# Patient Record
Sex: Female | Born: 1975 | Race: Black or African American | Hispanic: No | Marital: Single | Smoking: Never smoker
Health system: Southern US, Community
[De-identification: ages and names within clinical notes are randomized; demographics above are authoritative.]

## PROBLEM LIST (undated history)

## (undated) DIAGNOSIS — I10 Essential (primary) hypertension: Secondary | ICD-10-CM

## (undated) DIAGNOSIS — F419 Anxiety disorder, unspecified: Secondary | ICD-10-CM

## (undated) HISTORY — DX: Anxiety disorder, unspecified: F41.9

## (undated) HISTORY — DX: Essential (primary) hypertension: I10

---

## 2010-07-26 ENCOUNTER — Emergency Department (INDEPENDENT_AMBULATORY_CARE_PROVIDER_SITE_OTHER): Payer: Self-pay

## 2010-07-26 ENCOUNTER — Emergency Department (HOSPITAL_BASED_OUTPATIENT_CLINIC_OR_DEPARTMENT_OTHER)
Admission: EM | Admit: 2010-07-26 | Discharge: 2010-07-26 | Disposition: A | Discharge status: Home / Self Care | Payer: Self-pay | Attending: Emergency Medicine | Admitting: Emergency Medicine

## 2010-07-26 DIAGNOSIS — R079 Chest pain, unspecified: Secondary | ICD-10-CM | POA: Insufficient documentation

## 2010-07-26 DIAGNOSIS — F411 Generalized anxiety disorder: Secondary | ICD-10-CM | POA: Insufficient documentation

## 2010-07-26 DIAGNOSIS — I10 Essential (primary) hypertension: Secondary | ICD-10-CM | POA: Insufficient documentation

## 2010-07-26 DIAGNOSIS — F341 Dysthymic disorder: Secondary | ICD-10-CM | POA: Insufficient documentation

## 2010-07-26 LAB — CBC
HCT: 35.4 % — ABNORMAL LOW (ref 36.0–46.0)
Hemoglobin: 12.4 g/dL (ref 12.0–15.0)
MCV: 82.7 fL (ref 78.0–100.0)
RBC: 4.28 MIL/uL (ref 3.87–5.11)
WBC: 10.9 10*3/uL — ABNORMAL HIGH (ref 4.0–10.5)

## 2010-07-26 LAB — BASIC METABOLIC PANEL
BUN: 10 mg/dL (ref 6–23)
Chloride: 106 mEq/L (ref 96–112)
GFR calc non Af Amer: >60 mL/min (ref >60)
Glucose, Bld: 86 mg/dL (ref 70–99)
Potassium: 3.9 mEq/L (ref 3.5–5.1)
Sodium: 143 mEq/L (ref 135–145)

## 2010-07-26 LAB — POCT CARDIAC MARKERS
CKMB, poc: 1.3 ng/mL (ref 1.0–8.0)
Troponin i, poc: <0.05 ng/mL (ref 0.00–0.09)

## 2011-07-13 ENCOUNTER — Ambulatory Visit: Payer: PRIVATE HEALTH INSURANCE

## 2012-06-18 IMAGING — CR DG CHEST 2V
2 series · 2 of 2 positions shown · non-contrast
Comparison: None

CLINICAL DATA: Chest pain.

CHEST - 2 VIEW

[w chest pa *]
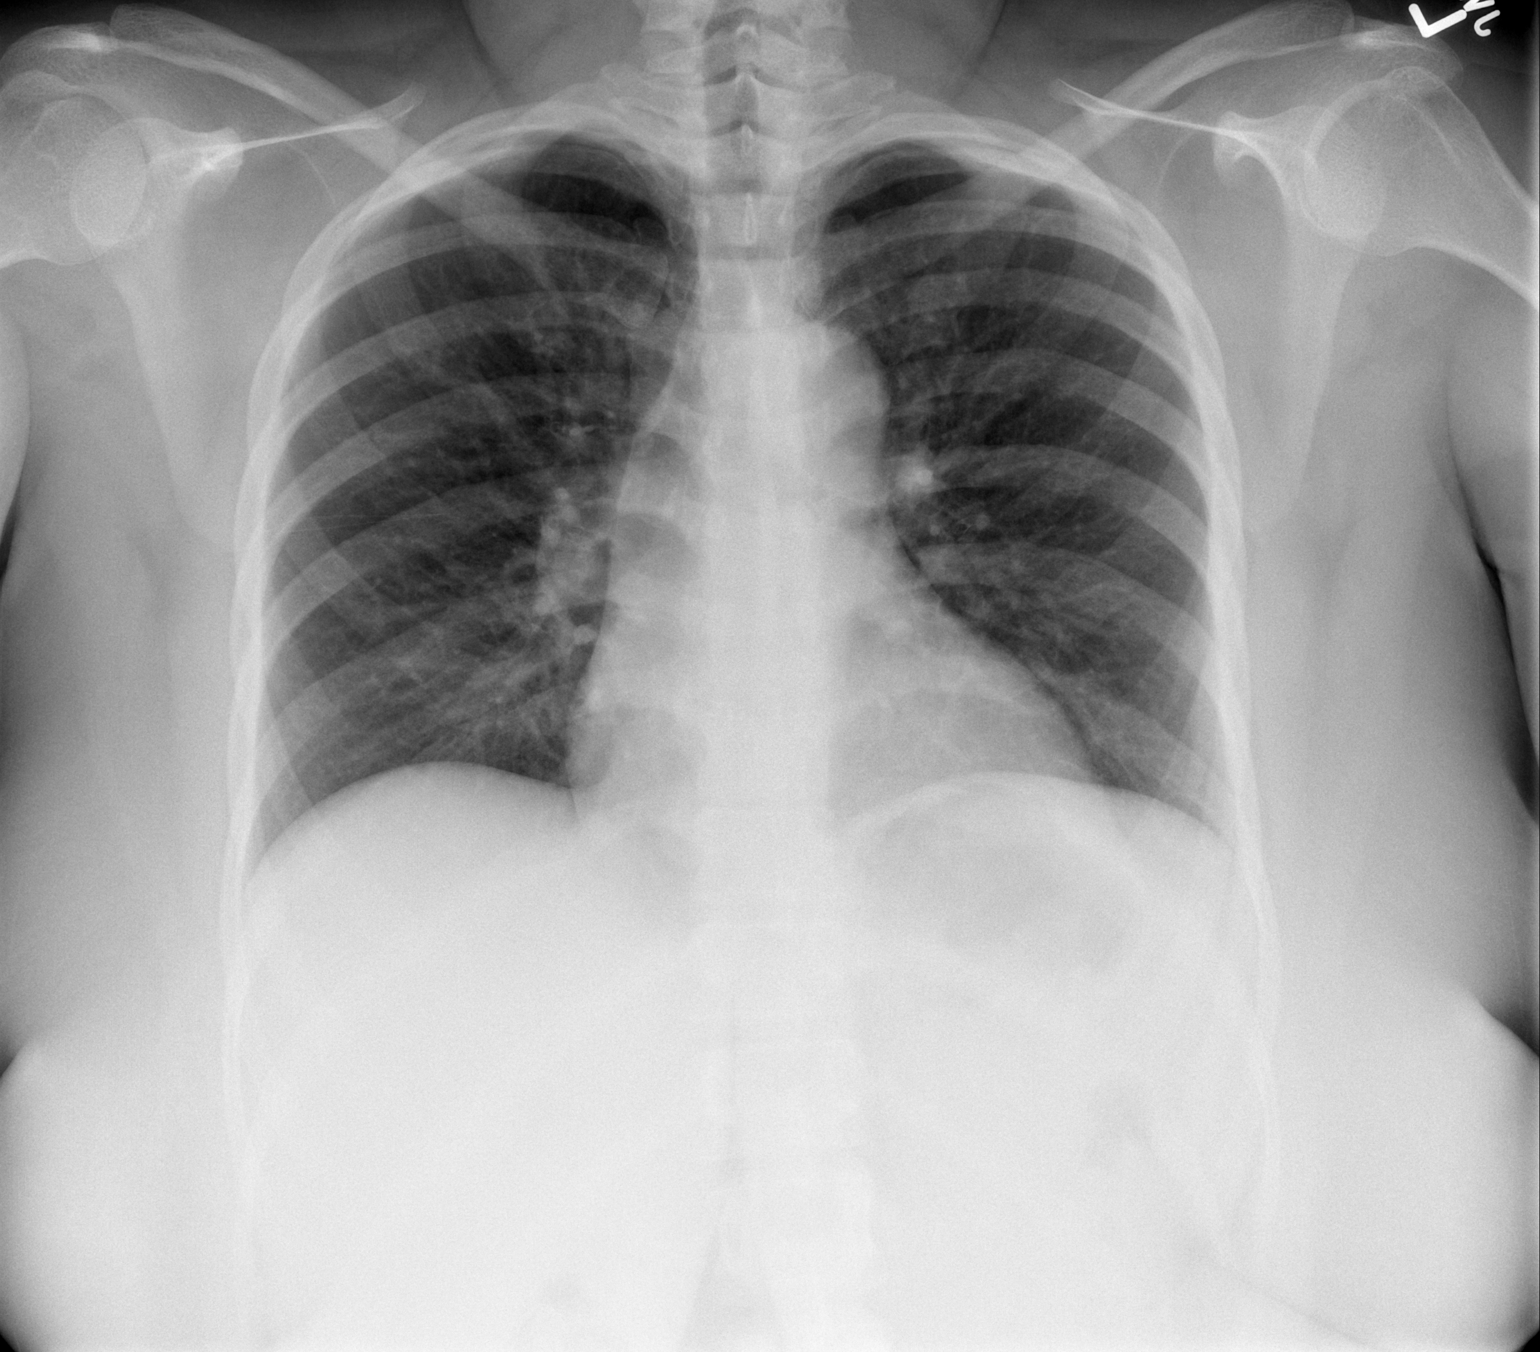

[w chest lat]
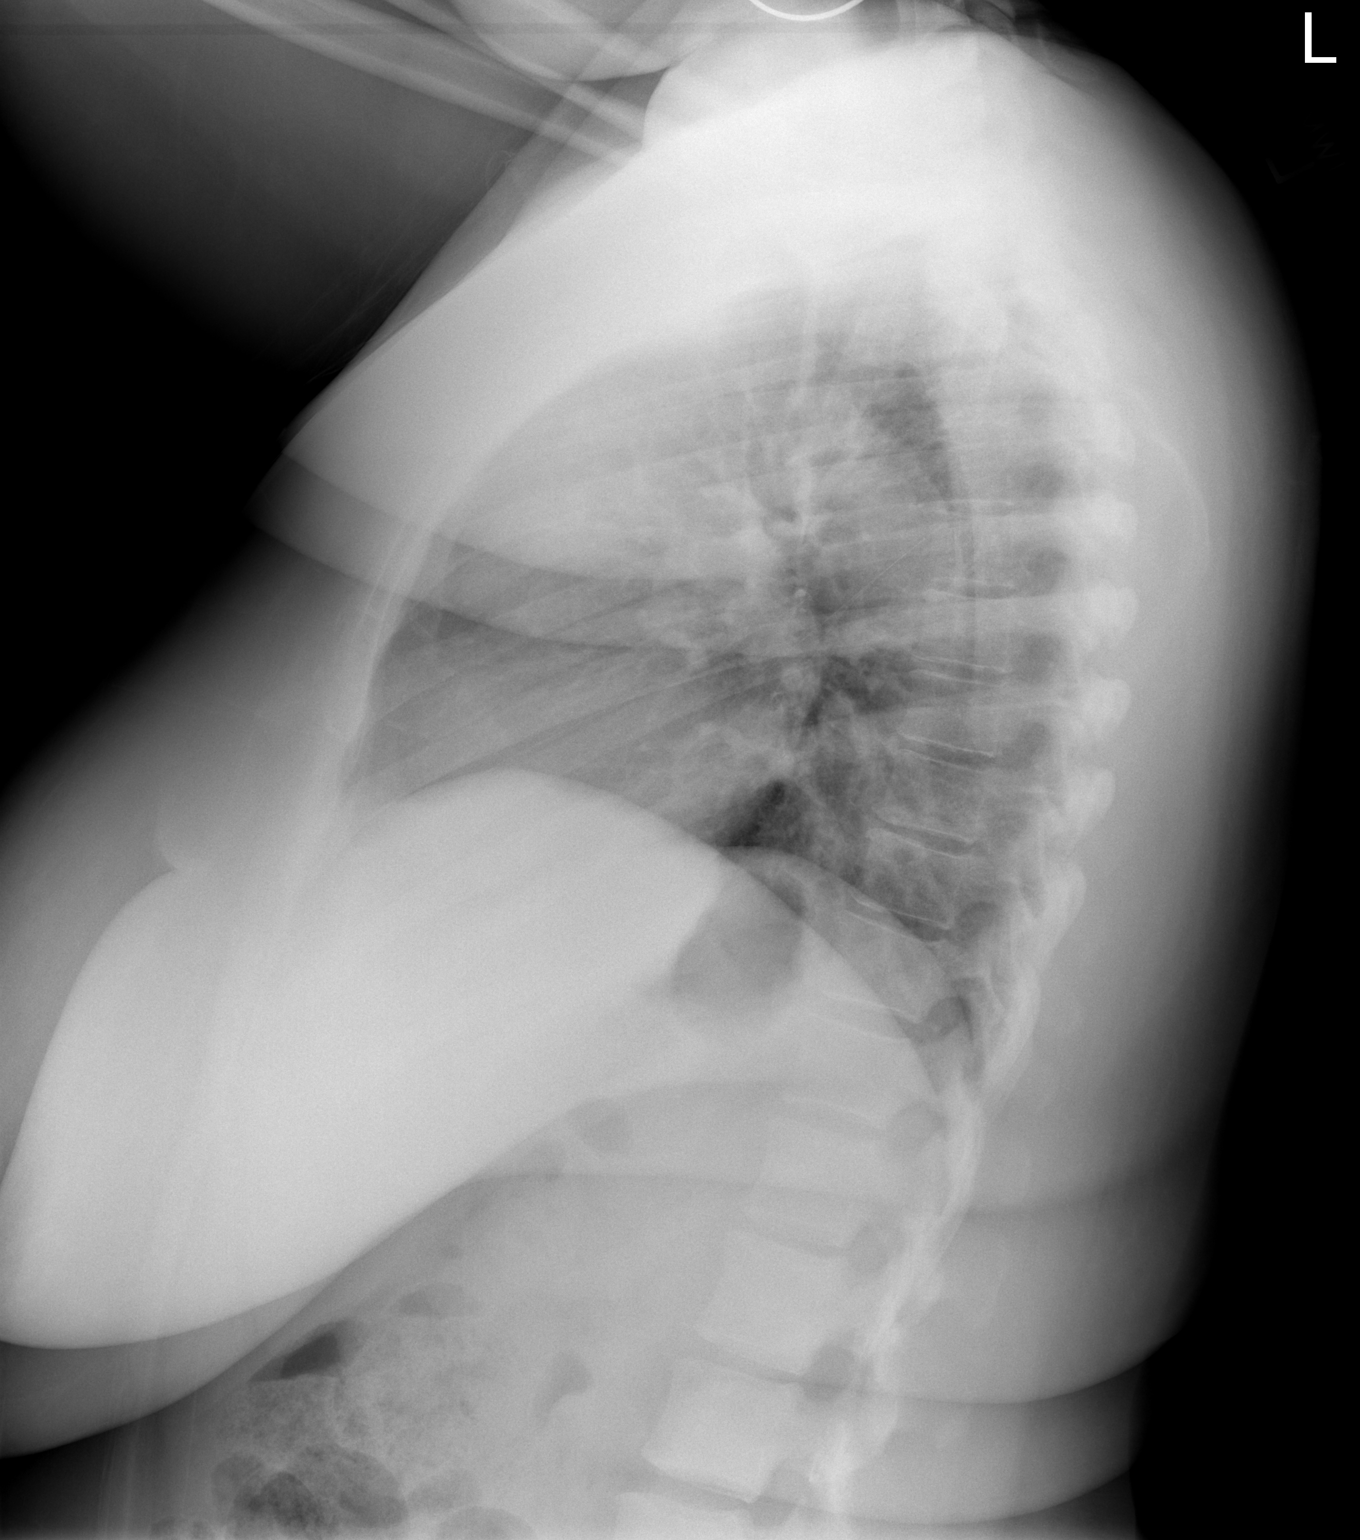

[2 of 2 positions shown; findings below may reference images not displayed]

FINDINGS: The heart size and mediastinal contours are within normal
limits.  Both lungs are clear.  The visualized skeletal structures
are unremarkable.
IMPRESSION: No active disease.

## 2012-10-13 ENCOUNTER — Ambulatory Visit (INDEPENDENT_AMBULATORY_CARE_PROVIDER_SITE_OTHER): Payer: BC Managed Care – PPO | Admitting: Family Medicine

## 2012-10-13 VITALS — BP 146/90 | HR 87 | Temp 98.8°F | Resp 18 | Ht 62.5 in | Wt 229.7 lb

## 2012-10-13 DIAGNOSIS — I10 Essential (primary) hypertension: Secondary | ICD-10-CM

## 2012-10-13 DIAGNOSIS — F329 Major depressive disorder, single episode, unspecified: Secondary | ICD-10-CM | POA: Insufficient documentation

## 2012-10-13 DIAGNOSIS — E669 Obesity, unspecified: Secondary | ICD-10-CM | POA: Insufficient documentation

## 2012-10-13 DIAGNOSIS — F410 Panic disorder [episodic paroxysmal anxiety] without agoraphobia: Secondary | ICD-10-CM

## 2012-10-13 LAB — COMPREHENSIVE METABOLIC PANEL
ALT: 23 U/L (ref 0–35)
AST: 11 U/L (ref 0–37)
CO2: 26 mEq/L (ref 19–32)
Calcium: 9.4 mg/dL (ref 8.4–10.5)
Chloride: 105 mEq/L (ref 96–112)
Creat: 0.54 mg/dL (ref 0.50–1.10)
Sodium: 141 mEq/L (ref 135–145)
Total Bilirubin: 0.5 mg/dL (ref 0.3–1.2)
Total Protein: 7.3 g/dL (ref 6.0–8.3)

## 2012-10-13 MED ORDER — CITALOPRAM HYDROBROMIDE 20 MG PO TABS: 20.0000 mg | ORAL_TABLET | Freq: Every day | ORAL | Status: AC

## 2012-10-13 MED ORDER — LISINOPRIL 10 MG PO TABS: 10.0000 mg | ORAL_TABLET | Freq: Every day | ORAL | Status: AC

## 2012-10-13 MED ORDER — ALPRAZOLAM 0.25 MG PO TABS: 0.2500 mg | ORAL_TABLET | Freq: Two times a day (BID) | ORAL | Status: DC | PRN

## 2012-10-13 NOTE — Progress Notes (Signed)
[facility] [address], [place] [address] [phone]  Date:  10/13/2012   Name:  Beverly Velasquez   DOB:  [birth year]   MRN:  [id number]  PCP:  No PCP Per Patient    Chief Complaint: Hypertension   History of Present Illness:  Beverly Velasquez is a 37 y.o. very pleasant female patient who presents with the following:  She is here today due to being out of her HTN medication for a couple of months- she takes lisinopril 10mg  a day and has been on this for about one year.   She has generally done well with lisinopril and does not note any SE.     She also notes that she sometimes gets anxious.  She feels that she has panic attacks.  She just recently started having these again, and she has had them for the past 2 years or so and has been evaluated for an associated episode of CP at the Ed (2012).   She used to take xanax for her anxiety- she last used this about 2 years ago, per her doctor at [facility].  She was given the xanax when she followed up after her ED visit.  She is fairly stressed right now- her grandmother died and she is getting divorced. She does feel that she is depressed having a hard time sleeping, less motivated than usual, appetite ok, no SI or HI.  She thinks she has taken celexa in the past with success.    LMP 10/05/12  There are no active problems to display for this patient.   Past Medical History  Diagnosis Date  . Hypertension   . Anxiety     History reviewed. No pertinent past surgical history.  History  Substance Use Topics  . Smoking status: Never Smoker   . Smokeless tobacco: Not on file  . Alcohol Use: Yes    Family History  Problem Relation Age of Onset  . Hypertension Mother   . Hypertension Father   . Diabetes Father     No Known Allergies  Medication list has been reviewed and updated.  No current outpatient prescriptions on file prior to visit.   No current facility-administered medications on file  prior to visit.    Review of Systems:  As per HPI- otherwise negative. Denies any SI or HI  Physical Examination: Filed Vitals:   10/13/12 1621  BP: 146/90  Pulse: 87  Temp: 98.8 F (37.1 C)  Resp: 18   Filed Vitals:   10/13/12 1621  Height: 5' 2.5" (1.588 m)  Weight: 229 lb 11.2 oz (104.191 kg)   Body mass index is 41.32 kg/(m^2). Ideal Body Weight: Weight in (lb) to have BMI = 25: 138.6  GEN: WDWN, NAD, Non-toxic, A & O x 3, obese HEENT: Atraumatic, Normocephalic. Neck supple. No masses, No LAD. Ears and Nose: No external deformity. CV: RRR, No M/G/R. No JVD. No thrill. No extra heart sounds. PULM: CTA B, no wheezes, crackles, rhonchi. No retractions. No resp. distress. No accessory muscle use. ABD: S, NT, ND, +BS. No rebound. No HSM. EXTR: No c/c/e NEURO Normal gait.  PSYCH: Normally interactive. Conversant. Not depressed or anxious appearing.  Calm demeanor.    Assessment and Plan: Essential hypertension, benign - Plan: lisinopril (PRINIVIL,ZESTRIL) 10 MG tablet, Comprehensive metabolic panel  Depression - Plan: citalopram (CELEXA) 20 MG tablet  Panic attacks - Plan: ALPRAZolam (XANAX) 0.25 MG tablet  Obesity, unspecified  HTN- restart medication,  check CMP.  Recheck in a few weeks, and follow- up pending labs. Warned pt to avoid pregnancy while on lisinopril.  She was not aware of this, but states she has no plans to conceive anytime soon  Panic attacks and depression; start celexa, and gave a low dose of xanax to use prn.  Asked her to contact me with an update in a couple of weeks   Signed Abbe Amsterdam, MD

## 2012-10-13 NOTE — Patient Instructions (Addendum)
Start back on the lisinopril for your blood pressure- let me know if this is not helpful.   Start the celexa at 20 mg a day- in about 2 weeks please contact me with an update Use the xanax sparingly as needed for panic.

## 2012-10-14 ENCOUNTER — Encounter: Payer: Self-pay | Admitting: Family Medicine

## 2012-11-02 ENCOUNTER — Telehealth: Payer: Self-pay

## 2012-11-02 NOTE — Telephone Encounter (Signed)
Did not call her, but Dr Patsy Lager may have.

## 2012-11-02 NOTE — Telephone Encounter (Signed)
Pt states that someone called her on Monday and she is not sure who called I didn't see any documentation about a phone call so she asked if I could put in a message to see why someone could have called her Call back number is [phone]

## 2012-11-03 NOTE — Telephone Encounter (Signed)
I don't think that I did- is there anything that she needs? JC

## 2012-11-07 NOTE — Telephone Encounter (Signed)
Called her back and LMOM- I do not think that I called her, but please let me know if there is something that she needs.  Also, please come and see me in the next few weeks to recheck her BP

## 2013-04-02 ENCOUNTER — Ambulatory Visit: Payer: BC Managed Care – PPO | Admitting: Family Medicine

## 2013-06-01 ENCOUNTER — Other Ambulatory Visit: Payer: Self-pay | Admitting: Family Medicine

## 2013-06-01 DIAGNOSIS — F411 Generalized anxiety disorder: Secondary | ICD-10-CM

## 2013-06-04 NOTE — Telephone Encounter (Signed)
Pt is overdue for f/up. I have pended x 1 w/note to RTC for more.

## 2013-10-20 ENCOUNTER — Encounter (HOSPITAL_COMMUNITY): Payer: Self-pay | Admitting: Emergency Medicine

## 2013-10-20 ENCOUNTER — Emergency Department (HOSPITAL_COMMUNITY)
Admission: EM | Admit: 2013-10-20 | Discharge: 2013-10-20 | Disposition: A | Discharge status: Home / Self Care | Payer: BC Managed Care – PPO | Source: Home / Self Care | Attending: Family Medicine | Admitting: Family Medicine

## 2013-10-20 DIAGNOSIS — K047 Periapical abscess without sinus: Secondary | ICD-10-CM

## 2013-10-20 MED ORDER — CLINDAMYCIN HCL 300 MG PO CAPS: 300.0000 mg | ORAL_CAPSULE | Freq: Three times a day (TID) | ORAL | Status: AC

## 2013-10-20 NOTE — ED Provider Notes (Signed)
CSN: [id number]     Arrival date & time 10/20/13  1818 History   First MD Initiated Contact with Patient 10/20/13 1826     No chief complaint on file.  (Consider location/radiation/quality/duration/timing/severity/associated sxs/prior Treatment) Patient is a 38 y.o. female presenting with tooth pain.  Dental Pain Location:  Upper Upper teeth location:  15/LU 2nd molar Quality:  Dull and throbbing Severity:  Moderate Onset quality:  Gradual Duration:  5 days Progression:  Worsening Chronicity:  New Context: dental caries and poor dentition   Associated symptoms: no facial swelling and no fever     Past Medical History  Diagnosis Date  . Hypertension   . Anxiety    No past surgical history on file. Family History  Problem Relation Age of Onset  . Hypertension Mother   . Hypertension Father   . Diabetes Father    History  Substance Use Topics  . Smoking status: Never Smoker   . Smokeless tobacco: Not on file  . Alcohol Use: Yes   OB History   Grav Para Term Preterm Abortions TAB SAB Ect Mult Living                 Review of Systems  Constitutional: Negative for fever.  HENT: Positive for dental problem. Negative for facial swelling.     Allergies  Review of patient's allergies indicates no known allergies.  Home Medications   Prior to Admission medications   Medication Sig Start Date End Date Taking? Authorizing Provider  ALPRAZolam (XANAX) 0.25 MG tablet Take 1 tablet (0.25 mg total) by mouth 2 (two) times daily as needed. PATIENT NEEDS OFFICE VISIT FOR ADDITIONAL REFILLS    Gwenlyn FoundJessica C Copland, MD  citalopram (CELEXA) 20 MG tablet Take 1 tablet (20 mg total) by mouth daily. 10/13/12   Gwenlyn FoundJessica C Copland, MD  clindamycin (CLEOCIN) 300 MG capsule Take 1 capsule (300 mg total) by mouth 3 (three) times daily. 10/20/13   Linna HoffJames D Kindl, MD  lisinopril (PRINIVIL,ZESTRIL) 10 MG tablet Take 1 tablet (10 mg total) by mouth daily. 10/13/12   Gwenlyn FoundJessica C Copland, MD   BP  175/118  Pulse 96  Temp(Src) 98.5 F (36.9 C) (Oral)  SpO2 100% Physical Exam  Nursing note and vitals reviewed. Constitutional: She is oriented to person, place, and time. She appears well-developed and well-nourished. No distress.  HENT:  Mouth/Throat: Uvula is midline, oropharynx is clear and moist and mucous membranes are normal. Abnormal dentition. Dental abscesses and dental caries present.    Lymphadenopathy:    She has no cervical adenopathy.  Neurological: She is alert and oriented to person, place, and time.  Skin: Skin is warm and dry.    ED Course  Procedures (including critical care time) Labs Review Labs Reviewed - No data to display  Imaging Review No results found.   MDM   1. Dental abscess        Linna HoffJames D Kindl, MD 10/20/13 [phone]

## 2013-10-20 NOTE — ED Notes (Signed)
Pt         Has     Toothache      l  Side      Face  Of    Face  History  Of  Dental  Caries  In  Past

## 2016-10-04 ENCOUNTER — Encounter: Payer: Self-pay | Admitting: Physician Assistant

## 2016-10-04 ENCOUNTER — Ambulatory Visit (INDEPENDENT_AMBULATORY_CARE_PROVIDER_SITE_OTHER): Payer: BLUE CROSS/BLUE SHIELD | Admitting: Physician Assistant

## 2016-10-04 ENCOUNTER — Ambulatory Visit (INDEPENDENT_AMBULATORY_CARE_PROVIDER_SITE_OTHER): Payer: BLUE CROSS/BLUE SHIELD

## 2016-10-04 VITALS — BP 140/94 | HR 98 | Temp 98.0°F | Resp 18 | Ht 62.36 in | Wt 220.4 lb

## 2016-10-04 DIAGNOSIS — R1030 Lower abdominal pain, unspecified: Secondary | ICD-10-CM | POA: Diagnosis not present

## 2016-10-04 DIAGNOSIS — D72829 Elevated white blood cell count, unspecified: Secondary | ICD-10-CM

## 2016-10-04 LAB — POCT URINALYSIS DIP (MANUAL ENTRY)
BILIRUBIN UA: NEGATIVE mg/dL
Bilirubin, UA: NEGATIVE
GLUCOSE UA: NEGATIVE mg/dL
Leukocytes, UA: NEGATIVE
Nitrite, UA: NEGATIVE
PROTEIN UA: NEGATIVE mg/dL
SPEC GRAV UA: 1.01 (ref 1.010–1.025)
Urobilinogen, UA: 0.2 E.U./dL
pH, UA: 6 (ref 5.0–8.0)

## 2016-10-04 LAB — CMP14+EGFR
A/G RATIO: 1.2 (ref 1.2–2.2)
ALBUMIN: 4.3 g/dL (ref 3.5–5.5)
ALT: 35 IU/L — ABNORMAL HIGH (ref 0–32)
AST: 16 IU/L (ref 0–40)
Alkaline Phosphatase: 103 IU/L (ref 39–117)
BILIRUBIN TOTAL: 0.4 mg/dL (ref 0.0–1.2)
BUN / CREAT RATIO: 15 (ref 9–23)
BUN: 12 mg/dL (ref 6–24)
CHLORIDE: 99 mmol/L (ref 96–106)
CO2: 27 mmol/L (ref 20–29)
Calcium: 9.7 mg/dL (ref 8.7–10.2)
Creatinine, Ser: 0.81 mg/dL (ref 0.57–1.00)
GFR calc non Af Amer: 91 mL/min/{1.73_m2} (ref >59)
GFR, EST AFRICAN AMERICAN: 105 mL/min/{1.73_m2} (ref >59)
Globulin, Total: 3.5 g/dL (ref 1.5–4.5)
Glucose: 92 mg/dL (ref 65–99)
POTASSIUM: 4 mmol/L (ref 3.5–5.2)
Sodium: 139 mmol/L (ref 134–144)
TOTAL PROTEIN: 7.8 g/dL (ref 6.0–8.5)

## 2016-10-04 LAB — POCT CBC
Granulocyte percent: 74.9 %G (ref 37–80)
HEMATOCRIT: 30.7 % — AB (ref 37.7–47.9)
Hemoglobin: 10.5 g/dL — AB (ref 12.2–16.2)
LYMPH, POC: 2.4 (ref 0.6–3.4)
MCH, POC: 28.3 pg (ref 27–31.2)
MCHC: 34.4 g/dL (ref 31.8–35.4)
MCV: 82.4 fL (ref 80–97)
MID (cbc): 0.4 (ref 0–0.9)
MPV: 7.4 fL (ref 0–99.8)
POC GRANULOCYTE: 8.2 — AB (ref 2–6.9)
POC LYMPH PERCENT: 21.8 %L (ref 10–50)
POC MID %: 3.3 % (ref 0–12)
Platelet Count, POC: 393 10*3/uL (ref 142–424)
RBC: 3.72 M/uL — AB (ref 4.04–5.48)
RDW, POC: 14.8 %
WBC: 11 10*3/uL — AB (ref 4.6–10.2)

## 2016-10-04 LAB — POC MICROSCOPIC URINALYSIS (UMFC): MUCUS RE: ABSENT

## 2016-10-04 LAB — POCT URINE PREGNANCY: Preg Test, Ur: NEGATIVE

## 2016-10-04 LAB — POCT WET + KOH PREP
Trich by wet prep: ABSENT
Yeast by KOH: ABSENT
Yeast by wet prep: ABSENT

## 2016-10-04 MED ORDER — KETOROLAC TROMETHAMINE 60 MG/2ML IM SOLN
60.0000 mg | Freq: Once | INTRAMUSCULAR | Status: AC
  Administered 2016-10-04: 60 mg via INTRAMUSCULAR

## 2016-10-04 MED ORDER — KETOROLAC TROMETHAMINE 10 MG PO TABS: 10.0000 mg | ORAL_TABLET | Freq: Four times a day (QID) | ORAL | 0 refills | Status: AC | PRN

## 2016-10-04 NOTE — Patient Instructions (Addendum)
Your plain films did not show any kidney stone. The medication you were given a shot of today should help if this pain is related to your menstrual cycle or an underlying stone that was not present on the xray. I have sent your urine off for a culture and should have those results in 48 hours. As mentioned in office, we need you to be off of your cycle to further evaluate this pain as it may just be related to your cycle. Therefore, I have given you some oral medication to use for the next couple days. If your pain persists after your cycle ends, please return for further evaluation. If anything worsens in the meantime, seek care immediately at the ED. If your pain persists, we may need to do further imaging. Thank you for letting me participate in your health and well being.   IF you received an x-ray today, you will receive an invoice from [facility]. Please contact [facility] at [phone] with questions or concerns regarding your invoice.   IF you received labwork today, you will receive an invoice from [facility]. Please contact [facility] at [phone] with questions or concerns regarding your invoice.   Our billing staff will not be able to assist you with questions regarding bills from these companies.  You will be contacted with the lab results as soon as they are available. The fastest way to get your results is to activate your My Chart account. Instructions are located on the last page of this paperwork. If you have not heard from us regarding the results in 2 weeks, please contact this office.

## 2016-10-04 NOTE — Progress Notes (Signed)
Beverly Velasquez  MRN: [id number] DOB: [birth year]  Subjective:  Beverly Velasquez is a 41 y.o. female who presents for evaluation of abdominal pain. Onset was 2 weeks ago. Symptoms have been gradually improving. The pain is described as aching, and is 8/10 in intensity. Pain is located in the suprapubic region with radiation to right back.  Aggravating factors: none.  Alleviating factors:none. Tried aleve and it did not work. Associated symptoms: urinary frequency and nausea. Does note she has also felt a little constipated. Last bowel movement was today, had to strain a little. The patient denies anorexia, chills, diarrhea, dysuria, fever, hematochezia, hematuria, melena, myalgias, sweats and vomiting. She is currently on her menstrual cycle, started about a week ago. Notes she always has cramps during her cycles but they usually resolve with aleve. Denies sexual activity. Drinks a glass of wine twice a week. Denies smoking. No past surgical history.   She saw her PCP, Dr. Ronnald Ramp, for this two weeks ago and he treated her with bactrim for a bladder infection. She completed antibiotics and notes it got a little better but not fully.   Review of Systems  HENT: Negative for congestion.   Respiratory: Negative for cough and shortness of breath.   Cardiovascular: Negative for chest pain and palpitations.  Genitourinary: Negative for vaginal discharge and vaginal pain.  Neurological: Negative for weakness and numbness.      Patient Active Problem List   Diagnosis Date Noted  . Essential hypertension, benign 10/13/2012  . Obesity, unspecified 10/13/2012  . Depression 10/13/2012    Current Outpatient Prescriptions on File Prior to Visit  Medication Sig Dispense Refill  . ALPRAZolam (XANAX) 0.25 MG tablet Take 1 tablet (0.25 mg total) by mouth 2 (two) times daily as needed. PATIENT NEEDS OFFICE VISIT FOR ADDITIONAL REFILLS (Patient not taking: Reported on 10/04/2016) 20 tablet 0  . citalopram  (CELEXA) 20 MG tablet Take 1 tablet (20 mg total) by mouth daily. (Patient not taking: Reported on 10/04/2016) 30 tablet 3  . clindamycin (CLEOCIN) 300 MG capsule Take 1 capsule (300 mg total) by mouth 3 (three) times daily. (Patient not taking: Reported on 10/04/2016) 21 capsule 0  . lisinopril (PRINIVIL,ZESTRIL) 10 MG tablet Take 1 tablet (10 mg total) by mouth daily. (Patient not taking: Reported on 10/04/2016) 90 tablet 3   No current facility-administered medications on file prior to visit.     No Known Allergies     Social History   Social History  . Marital status: Married    Spouse name: N/A  . Number of children: N/A  . Years of education: N/A   Occupational History  . Not on file.   Social History Main Topics  . Smoking status: Never Smoker  . Smokeless tobacco: Never Used  . Alcohol use 1.8 oz/week    2 Glasses of wine, 1 Shots of liquor per week  . Drug use: No  . Sexual activity: Not Currently   Other Topics Concern  . Not on file   Social History Narrative  . No narrative on file    Objective:  BP (!) 140/94 (BP Location: Right Arm, Patient Position: Sitting, Cuff Size: Large)   Pulse 98   Temp 98 F (36.7 C) (Oral)   Resp 18   Ht 5' 2.36" (1.584 m)   Wt 220 lb 6.4 oz (100 kg)   LMP 09/24/2016 (Exact Date)   SpO2 99%   BMI 39.85 kg/m   Physical Exam  Constitutional: She  is oriented to person, place, and time and well-developed, well-nourished, and in no distress.  HENT:  Head: Normocephalic and atraumatic.  Eyes: Conjunctivae are normal.  Neck: Normal range of motion.  Pulmonary/Chest: Effort normal.  Abdominal: Soft. Bowel sounds are normal. There is generalized tenderness (mild, most notable in suprapubic region). There is no rigidity, no guarding, no CVA tenderness, no tenderness at McBurney's point and negative Murphy's sign.  Genitourinary: Uterus normal, cervix normal, right adnexa normal, left adnexa normal and vulva normal. Thin  bloody  brown   red and vaginal discharge found.  Neurological: She is alert and oriented to person, place, and time. Gait normal.  Skin: Skin is warm and dry.  Psychiatric: Affect normal.  Vitals reviewed.    Results for orders placed or performed in visit on 10/04/16 (from the past 24 hour(s))  POCT urinalysis dipstick     Status: Abnormal   Collection Time: 10/04/16 11:01 AM  Result Value Ref Range   Color, UA yellow yellow   Clarity, UA clear clear   Glucose, UA negative negative mg/dL   Bilirubin, UA negative negative   Ketones, POC UA negative negative mg/dL   Spec Grav, UA 9.286 2.870 - 1.025   Blood, UA trace-lysed (A) negative   pH, UA 6.0 5.0 - 8.0   Protein Ur, POC negative negative mg/dL   Urobilinogen, UA 0.2 0.2 or 1.0 E.U./dL   Nitrite, UA Negative Negative   Leukocytes, UA Negative Negative  POCT urine pregnancy     Status: None   Collection Time: 10/04/16 12:12 PM  Result Value Ref Range   Preg Test, Ur Negative Negative  POCT CBC     Status: Abnormal   Collection Time: 10/04/16 12:17 PM  Result Value Ref Range   WBC 11.0 (A) 4.6 - 10.2 K/uL   Lymph, poc 2.4 0.6 - 3.4   POC LYMPH PERCENT 21.8 10 - 50 %L   MID (cbc) 0.4 0 - 0.9   POC MID % 3.3 0 - 12 %M   POC Granulocyte 8.2 (A) 2 - 6.9   Granulocyte percent 74.9 37 - 80 %G   RBC 3.72 (A) 4.04 - 5.48 M/uL   Hemoglobin 10.5 (A) 12.2 - 16.2 g/dL   HCT, POC 48.2 (A) 47.8 - 47.9 %   MCV 82.4 80 - 97 fL   MCH, POC 28.3 27 - 31.2 pg   MCHC 34.4 31.8 - 35.4 g/dL   RDW, POC 14.5 %   Platelet Count, POC 393 142 - 424 K/uL   MPV 7.4 0 - 99.8 fL  POCT Microscopic Urinalysis ([facility])     Status: Abnormal   Collection Time: 10/04/16 12:19 PM  Result Value Ref Range   WBC,UR,HPF,POC None None WBC/hpf   RBC,UR,HPF,POC None None RBC/hpf   Bacteria Many (A) None, Too numerous to count   Mucus Absent Absent   Epithelial Cells, UR Per Microscopy Few (A) None, Too numerous to count cells/hpf  POCT Wet + KOH Prep     Status: Abnormal     Collection Time: 10/04/16 12:19 PM  Result Value Ref Range   Yeast by KOH Absent Absent   Yeast by wet prep Absent Absent   WBC by wet prep None (A) Few   Clue Cells Wet Prep HPF POC Few (A) None   Trich by wet prep Absent Absent   Bacteria Wet Prep HPF POC Many (A) Few   Epithelial Cells By [employer] Pref ([facility]) Few None, Few, Too numerous to  count   RBC,UR,HPF,POC None None RBC/hpf   Dg Abd 1 View  Result Date: 10/04/2016 CLINICAL DATA:  41 year old female with intermittent suprapubic pain extending to the right back. Initial encounter. EXAM: ABDOMEN - 1 VIEW COMPARISON:  None. FINDINGS: The bowel gas pattern is normal. No radio-opaque calculi or other significant radiographic abnormality are seen. The possibility of free intraperitoneal air cannot be assessed on a supine view. IMPRESSION: Negative. Electronically Signed   By: Genia Del M.D.   On: 10/04/2016 13:00    Assessment and Plan :   1. Lower abdominal pain Labs pending. POCT testing, plain films, and PE findings are reassuring. She appears stable. This could likely be due to menstrual cycle cramps. DDx includes nephrolithiasis and ovarian cyst. While urine culture is pending, will treat with NSAIDs. Pt informed to return if symptoms persist once cycle has resolved, or sooner if symptoms worsen. Consider CT renal study or pelvic US if symptoms persist. Pt given strict ED precautions .- POCT urinalysis dipstick - POCT Microscopic Urinalysis ([facility]) - POCT urine pregnancy - POCT CBC - CMP14+EGFR - GC/Chlamydia Probe Amp - POCT Wet + KOH Prep - DG Abd 1 View; Future - Urine Culture - ketorolac (TORADOL) injection 60 mg; Inject 2 mLs (60 mg total) into the muscle once. - ketorolac (TORADOL) 10 MG tablet; Take 1 tablet (10 mg total) by mouth every 6 (six) hours as needed.  Dispense: 20 tablet; Refill: 0  2. Leukocytosis, unspecified type Labs pending. - POCT Microscopic Urinalysis ([facility]) - Urine Culture   Tenna Delaine  PA-C  [facility] 10/04/2016 12:21 PM

## 2016-10-04 NOTE — Progress Notes (Deleted)
Subjective:     Beverly Velasquez is a 41 y.o. female who presents for evaluation of abdominal pain. Onset was {numbers; 1-10:13787} {unit:19031} ago. Symptoms have been {course:17::"unchanged"}. The pain is described as {quality:19151}, and is {0-10:5044}/10 in intensity. Pain is located in the {anatomy; location abdomen:19149} {abdomen pain radiation:616}.  Aggravating factors: {aggrav factors:619}.  Alleviating factors: {allev factors:620}. Associated symptoms: {assoc symptoms:621}. The patient denies {assoc symptoms:19032}.  {Misc; AMB Common SmartLinks:21383::"The patient's history has been marked as reviewed and updated as appropriate."}  Review of Systems {ros - complete:30496}     Objective:    {Exam, Complete:17964}    Results for orders placed or performed in visit on 10/04/16 (from the past 24 hour(s))  POCT urinalysis dipstick     Status: Abnormal   Collection Time: 10/04/16 11:01 AM  Result Value Ref Range   Color, UA yellow yellow   Clarity, UA clear clear   Glucose, UA negative negative mg/dL   Bilirubin, UA negative negative   Ketones, POC UA negative negative mg/dL   Spec Grav, UA 1.6101.010 9.6041.010 - 1.025   Blood, UA trace-lysed (A) negative   pH, UA 6.0 5.0 - 8.0   Protein Ur, POC negative negative mg/dL   Urobilinogen, UA 0.2 0.2 or 1.0 E.U./dL   Nitrite, UA Negative Negative   Leukocytes, UA Negative Negative    Assessment:    Abdominal pain, likely secondary to *** .    Plan:    {abd pain treatment plan:14425}

## 2016-10-05 LAB — URINE CULTURE

## 2016-10-05 LAB — GC/CHLAMYDIA PROBE AMP
CHLAMYDIA, DNA PROBE: NEGATIVE
Neisseria gonorrhoeae by PCR: NEGATIVE

## 2017-08-30 ENCOUNTER — Encounter: Payer: Self-pay | Admitting: Emergency Medicine

## 2017-08-30 ENCOUNTER — Ambulatory Visit: Payer: Managed Care, Other (non HMO) | Admitting: Emergency Medicine

## 2017-08-30 ENCOUNTER — Telehealth: Payer: Self-pay | Admitting: General Practice

## 2017-08-30 ENCOUNTER — Other Ambulatory Visit: Payer: Self-pay

## 2017-08-30 VITALS — BP 160/86 | HR 80 | Temp 98.6°F | Resp 18

## 2017-08-30 DIAGNOSIS — M7918 Myalgia, other site: Secondary | ICD-10-CM | POA: Diagnosis not present

## 2017-08-30 DIAGNOSIS — I10 Essential (primary) hypertension: Secondary | ICD-10-CM | POA: Diagnosis not present

## 2017-08-30 DIAGNOSIS — M542 Cervicalgia: Secondary | ICD-10-CM | POA: Insufficient documentation

## 2017-08-30 MED ORDER — KETOROLAC TROMETHAMINE 60 MG/2ML IM SOLN
60.0000 mg | Freq: Once | INTRAMUSCULAR | Status: AC
  Administered 2017-08-30: 60 mg via INTRAMUSCULAR

## 2017-08-30 MED ORDER — DICLOFENAC SODIUM 75 MG PO TBEC: 75.0000 mg | DELAYED_RELEASE_TABLET | Freq: Two times a day (BID) | ORAL | 0 refills | Status: AC

## 2017-08-30 NOTE — Telephone Encounter (Signed)
Copied from CRM [id number]. Topic: Quick Communication - See Telephone Encounter >> Aug 30, 2017  2:18 PM Jay SchlichterWeikart, Melissa J wrote: CRM for notification. See Telephone encounter for: 08/30/17. Pt calling about voltaren gel. Pharm is telling her that an authorization is needed,  please call back at [phone]

## 2017-08-30 NOTE — Patient Instructions (Addendum)
     IF you received an x-ray today, you will receive an invoice from [facility]. Please contact [facility] at [phone] with questions or concerns regarding your invoice.   IF you received labwork today, you will receive an invoice from [facility]. Please contact [facility] at [phone] with questions or concerns regarding your invoice.   Our billing staff will not be able to assist you with questions regarding bills from these companies.  You will be contacted with the lab results as soon as they are available. The fastest way to get your results is to activate your My Chart account. Instructions are located on the last page of this paperwork. If you have not heard from us regarding the results in 2 weeks, please contact this office.     Cervical Radiculopathy Cervical radiculopathy means that a nerve in the neck is pinched or bruised. This can cause pain or loss of feeling (numbness) that runs from your neck to your arm and fingers. Follow these instructions at home: Managing pain  Take over-the-counter and prescription medicines only as told by your doctor.  If directed, put ice on the injured or painful area. ? Put ice in a plastic bag. ? Place a towel between your skin and the bag. ? Leave the ice on for 20 minutes, 2-3 times per day.  If ice does not help, you can try using heat. Take a warm shower or warm bath, or use a heat pack as told by your doctor.  You may try a gentle neck and shoulder massage. Activity  Rest as needed. Follow instructions from your doctor about any activities to avoid.  Do exercises as told by your doctor or physical therapist. General instructions  If you were given a soft collar, wear it as told by your doctor.  Use a flat pillow when you sleep.  Keep all follow-up visits as told by your doctor. This is important. Contact a doctor if:  Your condition does not improve with treatment. Get help right away if:  Your  pain gets worse and is not controlled with medicine.  You lose feeling or feel weak in your hand, arm, face, or leg.  You have a fever.  You have a stiff neck.  You cannot control when you poop or pee (have incontinence).  You have trouble with walking, balance, or talking. This information is not intended to replace advice given to you by your health care provider. Make sure you discuss any questions you have with your health care provider. Document Released: 03/04/2011 Document Revised: 08/21/2015 Document Reviewed: 05/09/2014 Elsevier Interactive Patient Education  2018 [employer]  

## 2017-08-30 NOTE — Progress Notes (Signed)
Beverly Velasquez 42 y.o.   Chief Complaint  Patient presents with  . Neck Pain    woke up this morning with right sided pain from neck to foot. LKN 1610. Pt states that neck pain is now main concern. Neuro assessment WNL.    HISTORY OF PRESENT ILLNESS: This is a 42 y.o. female complaining of right-sided neck pain radiating to the arm and leg started this morning.  Pain is now only to the right side of the neck.  Sharp and constant.  No neurological associated symptoms.  Denies headache.  HPI   Prior to Admission medications   Medication Sig Start Date End Date Taking? Authorizing Provider  lisinopril (PRINIVIL,ZESTRIL) 20 MG tablet Take 20 mg by mouth 2 (two) times daily.   Yes [provider]  ALPRAZolam (XANAX) 0.25 MG tablet Take 1 tablet (0.25 mg total) by mouth 2 (two) times daily as needed. PATIENT NEEDS OFFICE VISIT FOR ADDITIONAL REFILLS Patient not taking: Reported on 10/04/2016    Copland, Gwenlyn Found, MD  citalopram (CELEXA) 20 MG tablet Take 1 tablet (20 mg total) by mouth daily. Patient not taking: Reported on 10/04/2016 10/13/12   Copland, Gwenlyn Found, MD  clindamycin (CLEOCIN) 300 MG capsule Take 1 capsule (300 mg total) by mouth 3 (three) times daily. Patient not taking: Reported on 10/04/2016 10/20/13   Linna Hoff, MD  ketorolac (TORADOL) 10 MG tablet Take 1 tablet (10 mg total) by mouth every 6 (six) hours as needed. Patient not taking: Reported on 08/30/2017 10/04/16   Benjiman Core D, PA-C  lisinopril (PRINIVIL,ZESTRIL) 10 MG tablet Take 1 tablet (10 mg total) by mouth daily. Patient not taking: Reported on 10/04/2016 10/13/12   Copland, Gwenlyn Found, MD    No Known Allergies  Patient Active Problem List   Diagnosis Date Noted  . Essential hypertension, benign 10/13/2012  . Obesity, unspecified 10/13/2012  . Depression 10/13/2012    Past Medical History:  Diagnosis Date  . Anxiety   . Hypertension     History reviewed. No pertinent surgical  history.  Social History   Socioeconomic History  . Marital status: Married    Spouse name: Not on file  . Number of children: Not on file  . Years of education: Not on file  . Highest education level: Not on file  Occupational History  . Not on file  Social Needs  . Financial resource strain: Not on file  . Food insecurity:    Worry: Not on file    Inability: Not on file  . Transportation needs:    Medical: Not on file    Non-medical: Not on file  Tobacco Use  . Smoking status: Never Smoker  . Smokeless tobacco: Never Used  Substance and Sexual Activity  . Alcohol use: Yes    Alcohol/week: 1.8 oz    Types: 2 Glasses of wine, 1 Shots of liquor per week  . Drug use: No  . Sexual activity: Not Currently  Lifestyle  . Physical activity:    Days per week: Not on file    Minutes per session: Not on file  . Stress: Not on file  Relationships  . Social connections:    Talks on phone: Not on file    Gets together: Not on file    Attends religious service: Not on file    Active member of club or organization: Not on file    Attends meetings of clubs or organizations: Not on file    Relationship  status: Not on file  . Intimate partner violence:    Fear of current or ex partner: Not on file    Emotionally abused: Not on file    Physically abused: Not on file    Forced sexual activity: Not on file  Other Topics Concern  . Not on file  Social History Narrative  . Not on file    Family History  Problem Relation Age of Onset  . Hypertension Mother   . Hypertension Father   . Diabetes Father   . Heart disease Father   . Stroke Father      Review of Systems  Constitutional: Negative.  Negative for chills, fever and malaise/fatigue.  HENT: Negative.  Negative for congestion, ear pain, hearing loss, nosebleeds, sinus pain and sore throat.   Eyes: Negative.  Negative for blurred vision, double vision, photophobia and pain.  Respiratory: Negative.  Negative for cough,  hemoptysis and shortness of breath.   Cardiovascular: Negative.  Negative for chest pain, palpitations and leg swelling.  Gastrointestinal: Negative.  Negative for abdominal pain, blood in stool, diarrhea, nausea and vomiting.  Genitourinary: Negative.   Musculoskeletal: Positive for neck pain.  Skin: Negative.  Negative for rash.  Neurological: Negative.  Negative for dizziness, tingling, sensory change, speech change, focal weakness, loss of consciousness and headaches.  Endo/Heme/Allergies: Negative.   All other systems reviewed and are negative.   Vitals:   08/30/17 0819  BP: (!) 160/86  Pulse: 80  Resp: 18  Temp: 98.6 F (37 C)  SpO2: 99%    Physical Exam  Constitutional: She is oriented to person, place, and time. She appears well-developed and well-nourished.  HENT:  Head: Normocephalic and atraumatic.  Right Ear: External ear normal.  Left Ear: External ear normal.  Nose: Nose normal.  Mouth/Throat: Oropharynx is clear and moist.  Eyes: Pupils are equal, round, and reactive to light. Conjunctivae and EOM are normal.  Neck: Normal range of motion. Neck supple. No JVD present. Muscular tenderness present. Carotid bruit is not present. No thyromegaly present.    Cardiovascular: Normal rate, regular rhythm, normal heart sounds and intact distal pulses.  Pulmonary/Chest: Effort normal and breath sounds normal.  Abdominal: Soft. There is no tenderness.  Musculoskeletal: Normal range of motion. She exhibits no edema.  Lymphadenopathy:    She has no cervical adenopathy.  Neurological: She is alert and oriented to person, place, and time. She displays normal reflexes. No cranial nerve deficit or sensory deficit. She exhibits normal muscle tone. Coordination normal.  Skin: Skin is warm. Capillary refill takes less than 2 seconds. No rash noted.  Psychiatric: She has a normal mood and affect. Her behavior is normal.  Vitals reviewed.  A total of 25 minutes was spent in the  room with the patient, greater than 50% of which was in counseling/coordination of care regarding differential diagnosis, management, medications, symptoms to watch out for, and need for follow-up if no better or worse.   ASSESSMENT & PLAN: Beverly Velasquez was seen today for neck pain.  Diagnoses and all orders for this visit:  Neck pain -     ketorolac (TORADOL) injection 60 mg  Essential hypertension, benign  Musculoskeletal pain  Other orders -     diclofenac (VOLTAREN) 75 MG EC tablet; Take 1 tablet (75 mg total) by mouth 2 (two) times daily for 5 days.    Patient Instructions       IF you received an x-ray today, you will receive an invoice from [facility].  Please contact [facility] at [phone] with questions or concerns regarding your invoice.   IF you received labwork today, you will receive an invoice from [facility]. Please contact [facility] at [phone] with questions or concerns regarding your invoice.   Our billing staff will not be able to assist you with questions regarding bills from these companies.  You will be contacted with the lab results as soon as they are available. The fastest way to get your results is to activate your My Chart account. Instructions are located on the last page of this paperwork. If you have not heard from us regarding the results in 2 weeks, please contact this office.      Cervical Radiculopathy Cervical radiculopathy means that a nerve in the neck is pinched or bruised. This can cause pain or loss of feeling (numbness) that runs from your neck to your arm and fingers. Follow these instructions at home: Managing pain  Take over-the-counter and prescription medicines only as told by your doctor.  If directed, put ice on the injured or painful area. ? Put ice in a plastic bag. ? Place a towel between your skin and the bag. ? Leave the ice on for 20 minutes, 2-3 times per day.  If ice does not help, you can  try using heat. Take a warm shower or warm bath, or use a heat pack as told by your doctor.  You may try a gentle neck and shoulder massage. Activity  Rest as needed. Follow instructions from your doctor about any activities to avoid.  Do exercises as told by your doctor or physical therapist. General instructions  If you were given a soft collar, wear it as told by your doctor.  Use a flat pillow when you sleep.  Keep all follow-up visits as told by your doctor. This is important. Contact a doctor if:  Your condition does not improve with treatment. Get help right away if:  Your pain gets worse and is not controlled with medicine.  You lose feeling or feel weak in your hand, arm, face, or leg.  You have a fever.  You have a stiff neck.  You cannot control when you poop or pee (have incontinence).  You have trouble with walking, balance, or talking. This information is not intended to replace advice given to you by your health care provider. Make sure you discuss any questions you have with your health care provider. Document Released: 03/04/2011 Document Revised: 08/21/2015 Document Reviewed: 05/09/2014 Elsevier Interactive Patient Education  2018 [employer]      Edwina BarthMiguel Satara Virella, MD [facility]

## 2017-08-30 NOTE — Telephone Encounter (Signed)
PA

## 2017-08-31 ENCOUNTER — Ambulatory Visit: Payer: Self-pay | Admitting: *Deleted

## 2017-08-31 ENCOUNTER — Other Ambulatory Visit: Payer: Self-pay | Admitting: Emergency Medicine

## 2017-08-31 MED ORDER — IBUPROFEN 600 MG PO TABS: 600.0000 mg | ORAL_TABLET | Freq: Three times a day (TID) | ORAL | 1 refills | Status: AC | PRN

## 2017-08-31 NOTE — Telephone Encounter (Signed)
Called patient left message unable to verify insurance patient now has [employer].and we do not have a copy of that card.  Will need the 1-800 number on card to get PA started.  Do you want to call something else in for patient?

## 2017-08-31 NOTE — Telephone Encounter (Signed)
Received PA on this. There is not a diagnosis listed in the visit to be able to submit for payment through insurance. Letter placed in your box.

## 2017-08-31 NOTE — Telephone Encounter (Signed)
Patient calling, in a lot of pain. Checking status of PA, is there something else that can be called in ?

## 2017-08-31 NOTE — Telephone Encounter (Signed)
Patient called inquiring on status of her medication  Patient advised ibuprofen was sent to pharmacy today   Patient advised that info needed for her [employer] card  Patient stated that the pharmacy was [facility] on [address]   800 number on card is  [phone] for pharmacy    Patients ID number is [id number]    Customer service or provider number is [phone]

## 2017-08-31 NOTE — Telephone Encounter (Signed)
Called patient bcbs is not active .Marland Kitchen.  Found [employer] but unable to run with number in computer.   Not a copy of card. Left message with patient to call back with the pharmacy and 1-800 number and confirm her id number on her new insurance.  Also sent on to Sagardia to see if he would like to call something else in to paitent

## 2017-08-31 NOTE — Telephone Encounter (Signed)
May try Ibuprofen 600 mg instead. Called in to pharmacy. Thanks.

## 2017-08-31 NOTE — Telephone Encounter (Signed)
Patient  Inquiring about  Status of pain medication  Pt informed that ibuprofen was sent to pharmacy of choice today   Info needed for prior authorization for Voltaren  was needed for [employer] Card    Info per pt Pharmacy [facility] [address]    ID number is [id number]    Pharmacy 800  Number is    [phone]    Customer service is [phone]

## 2017-09-01 ENCOUNTER — Telehealth: Payer: Self-pay

## 2017-09-01 NOTE — Telephone Encounter (Signed)
Provider, please advise:   Pending Prescriptions Disp Refills  . diclofenac (VOLTAREN) 75 MG EC tablet 20 tablet 0    Sig: Take 1 tablet (75 mg total) by mouth 2 (two) times daily for 5 days.    Do you want to dispense 10 tabs or have patient take med above for 10 days? Please advise.

## 2017-09-01 NOTE — Telephone Encounter (Signed)
Phone call to pharmacy, sig clarified.

## 2017-09-01 NOTE — Telephone Encounter (Signed)
Take for five days and then as needed. Thanks.

## 2017-09-02 ENCOUNTER — Telehealth: Payer: Self-pay | Admitting: *Deleted

## 2017-09-02 NOTE — Telephone Encounter (Signed)
Faxed prescription for diclofenac 75 mg #20 take for 5 days and then twice a day as needed, per Dr Alvy BimlerSagardia. Confirmation page received.

## 2018-11-24 ENCOUNTER — Other Ambulatory Visit: Payer: Self-pay | Admitting: Endocrinology

## 2018-11-24 DIAGNOSIS — Z1231 Encounter for screening mammogram for malignant neoplasm of breast: Secondary | ICD-10-CM

## 2019-01-11 ENCOUNTER — Other Ambulatory Visit: Payer: Self-pay

## 2019-01-11 ENCOUNTER — Ambulatory Visit
Admission: RE | Admit: 2019-01-11 | Discharge: 2019-01-11 | Disposition: A | Discharge status: Home / Self Care | Payer: Managed Care, Other (non HMO) | Source: Ambulatory Visit | Attending: Endocrinology | Admitting: Endocrinology

## 2019-01-11 DIAGNOSIS — Z1231 Encounter for screening mammogram for malignant neoplasm of breast: Secondary | ICD-10-CM

## 2019-01-15 ENCOUNTER — Other Ambulatory Visit: Payer: Self-pay | Admitting: Endocrinology

## 2019-01-15 DIAGNOSIS — R928 Other abnormal and inconclusive findings on diagnostic imaging of breast: Secondary | ICD-10-CM

## 2019-01-16 ENCOUNTER — Other Ambulatory Visit: Payer: Self-pay | Admitting: Cardiology

## 2019-01-16 ENCOUNTER — Other Ambulatory Visit: Payer: Self-pay | Admitting: Family Medicine

## 2019-01-17 ENCOUNTER — Other Ambulatory Visit: Payer: Self-pay | Admitting: Endocrinology

## 2019-01-17 ENCOUNTER — Ambulatory Visit
Admission: RE | Admit: 2019-01-17 | Discharge: 2019-01-17 | Disposition: A | Discharge status: Home / Self Care | Payer: Managed Care, Other (non HMO) | Source: Ambulatory Visit | Attending: Endocrinology | Admitting: Endocrinology

## 2019-01-17 ENCOUNTER — Other Ambulatory Visit: Payer: Self-pay

## 2019-01-17 DIAGNOSIS — R928 Other abnormal and inconclusive findings on diagnostic imaging of breast: Secondary | ICD-10-CM

## 2019-01-17 DIAGNOSIS — N6489 Other specified disorders of breast: Secondary | ICD-10-CM

## 2019-08-20 ENCOUNTER — Ambulatory Visit
Admission: RE | Admit: 2019-08-20 | Discharge: 2019-08-20 | Disposition: A | Discharge status: Home / Self Care | Payer: Self-pay | Source: Ambulatory Visit | Attending: Endocrinology | Admitting: Endocrinology

## 2019-08-20 ENCOUNTER — Other Ambulatory Visit: Payer: Self-pay

## 2019-08-20 ENCOUNTER — Other Ambulatory Visit: Payer: Self-pay | Admitting: Endocrinology

## 2019-08-20 DIAGNOSIS — N6489 Other specified disorders of breast: Secondary | ICD-10-CM

## 2021-07-13 IMAGING — MG MM DIGITAL DIAGNOSTIC UNILAT*R* W/ TOMO W/ CAD
4 series · 4 of 12 positions shown · non-contrast
Comparison: Previous exam(s).

CLINICAL DATA: Follow-up probably benign asymmetry in the upper
right breast in the oblique projection at previous mammography, with
a probable normal island of dense glandular tissue seen at
ultrasound.

EXAM:
DIGITAL DIAGNOSTIC RIGHT MAMMOGRAM WITH CAD AND TOMO
ULTRASOUND RIGHT BREAST

[R CC synth-2D]
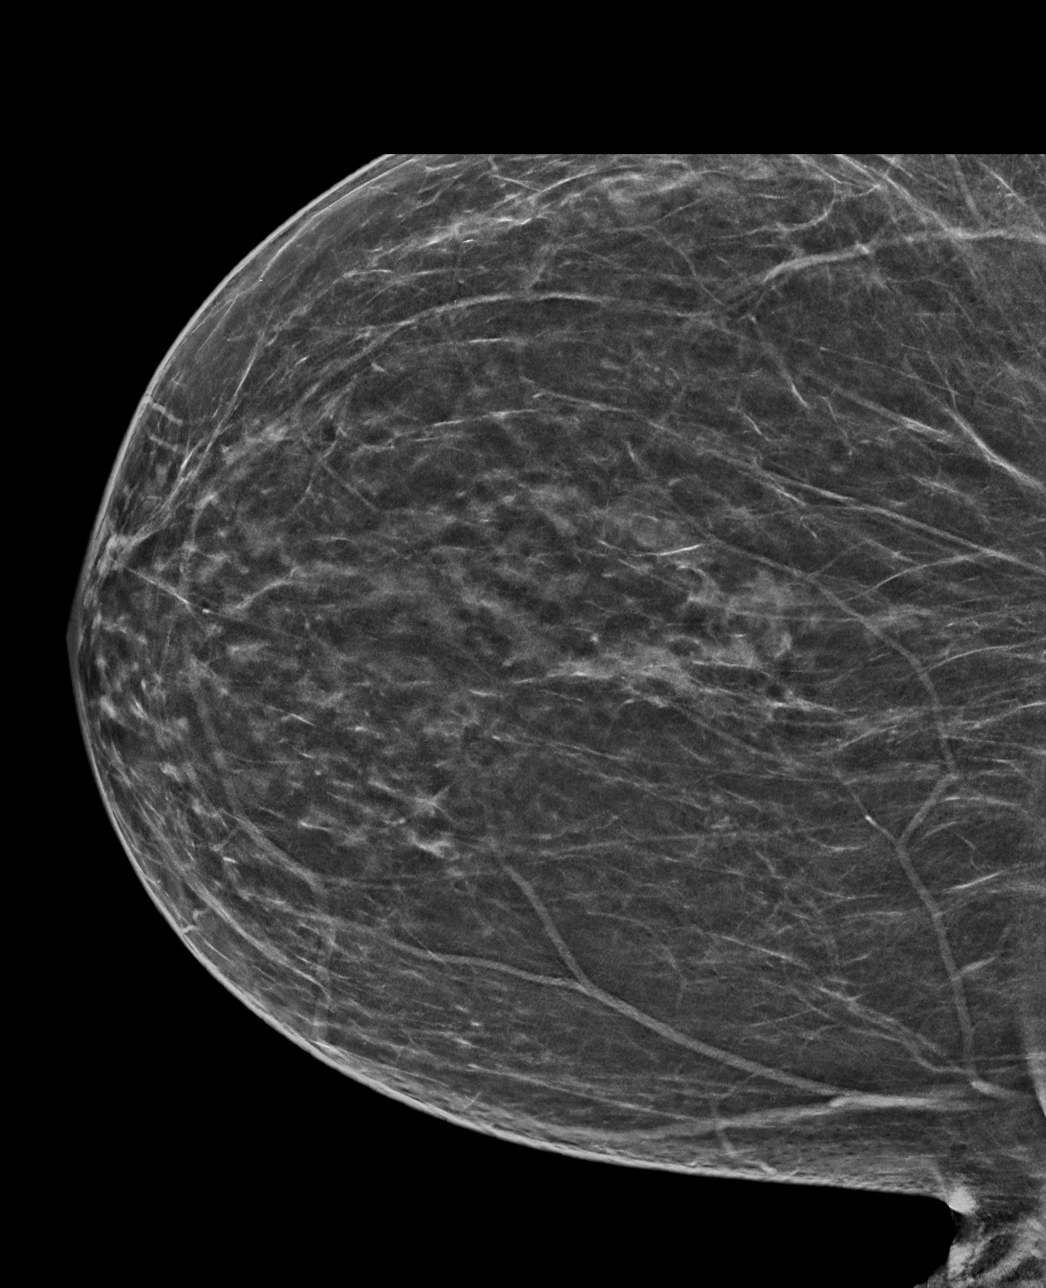

[R MLO synth-2D]
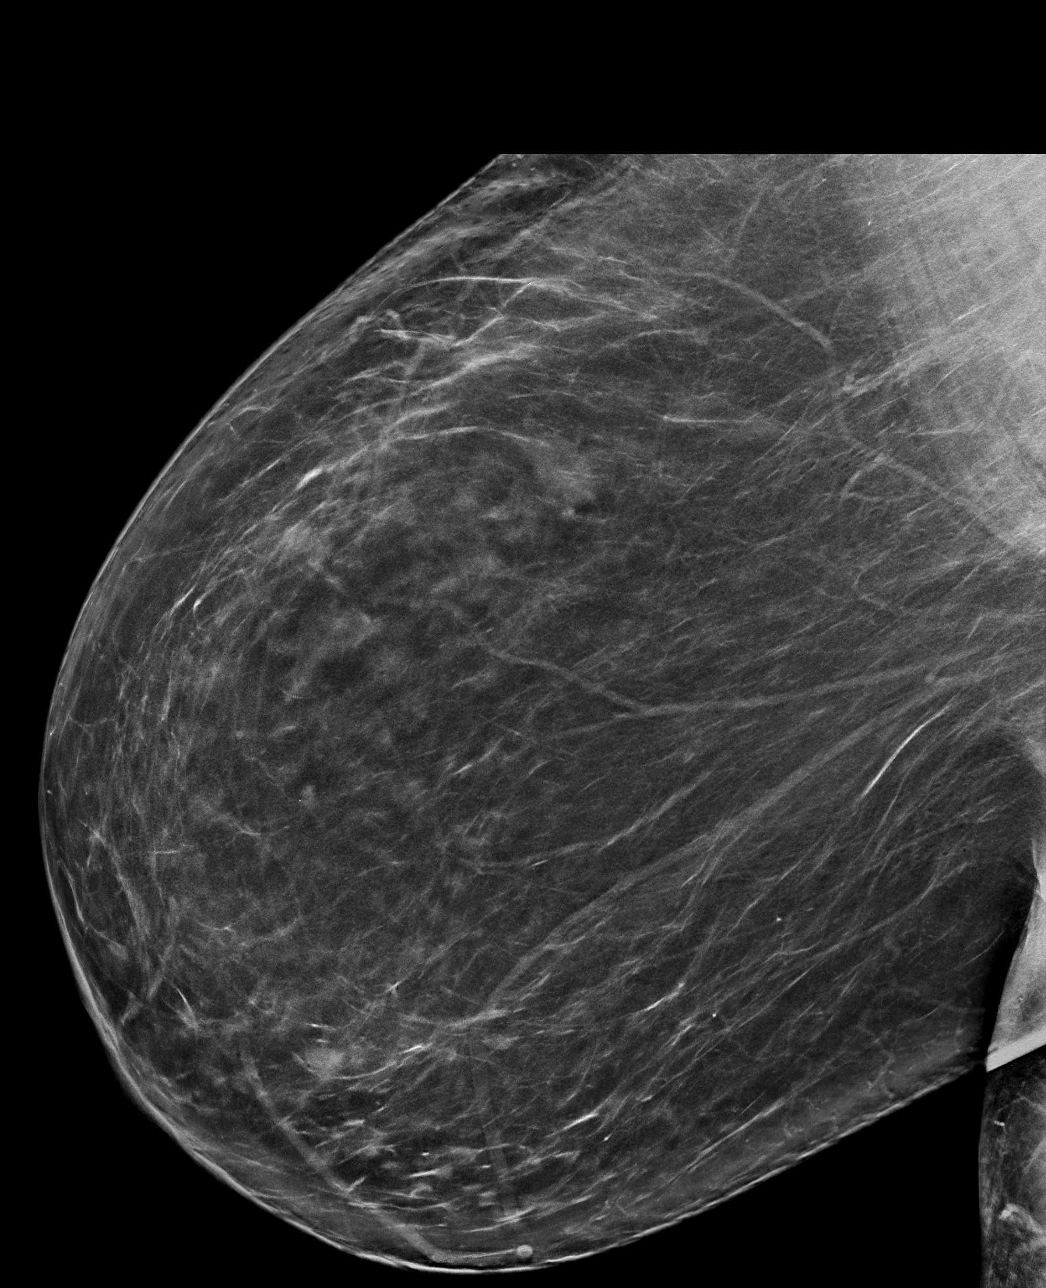

[R CC tomo · tomo slice 37/72.0]
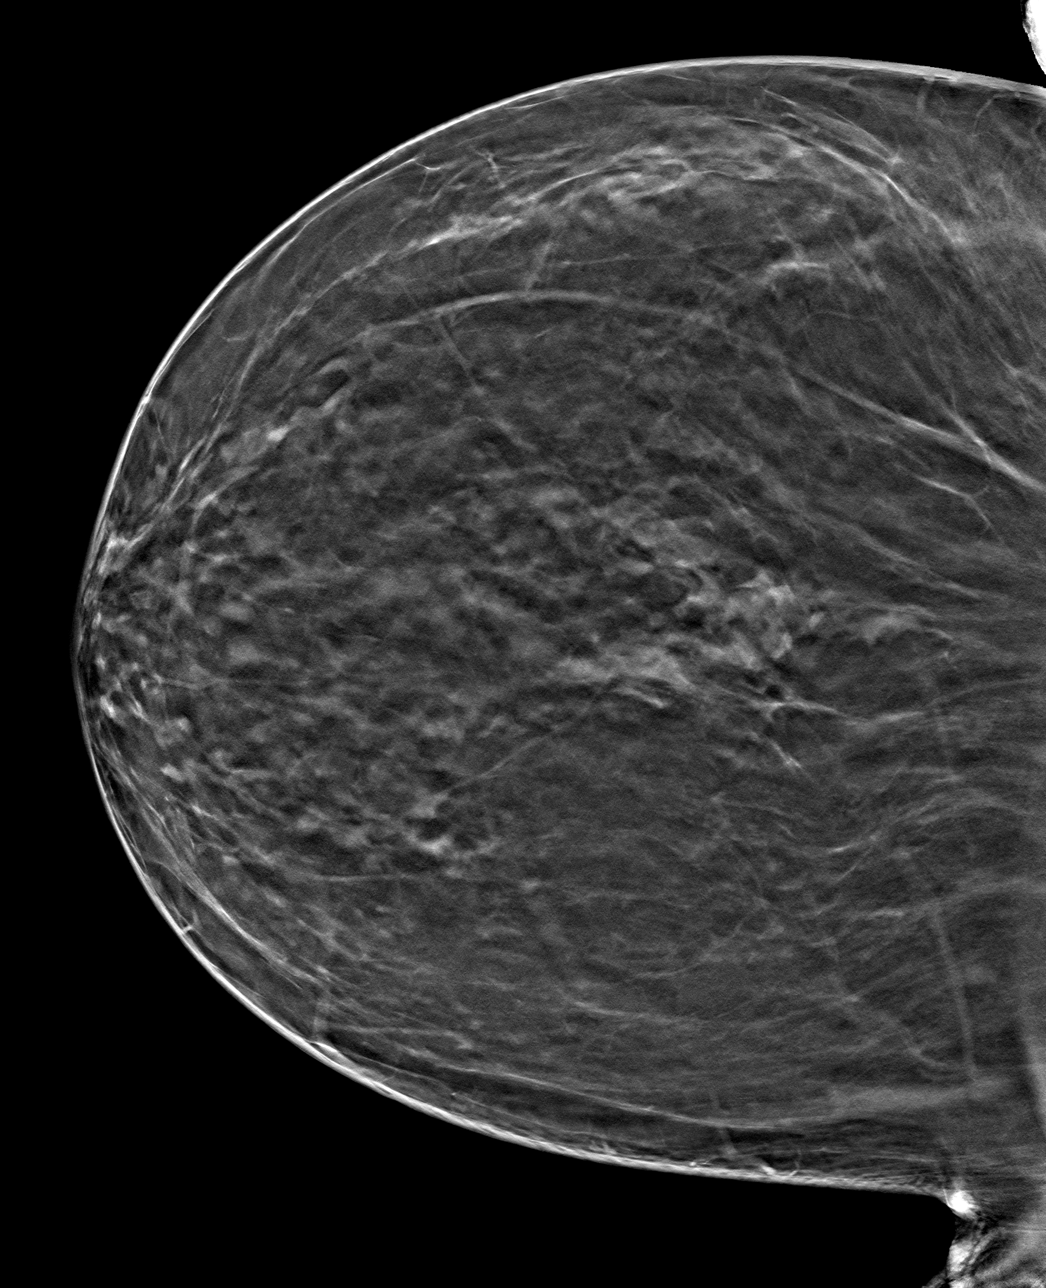

[R MLO tomo · tomo slice 45/88.0]
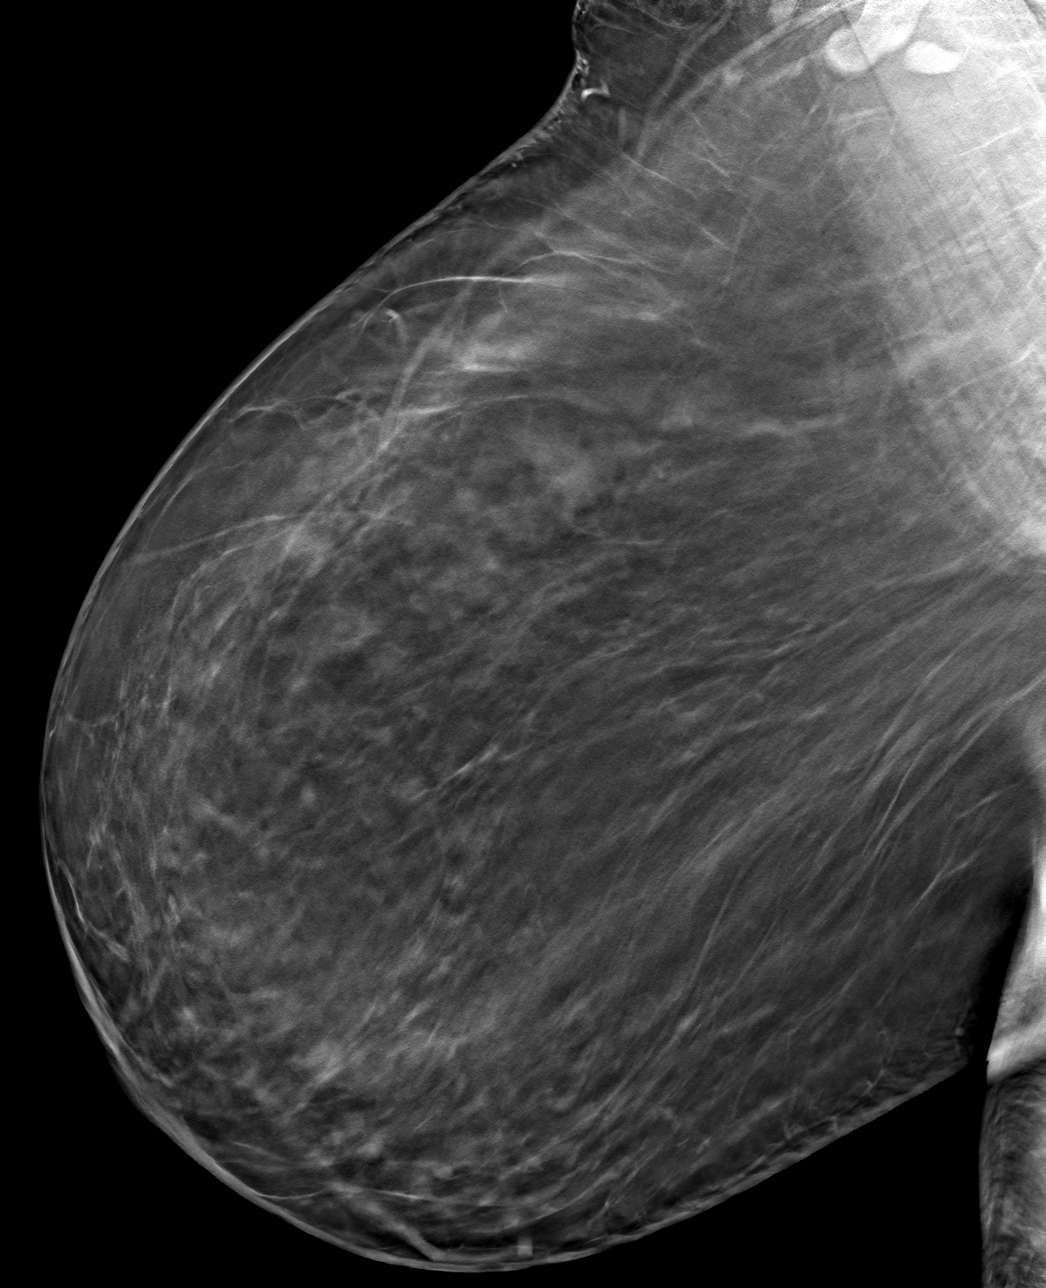

[4 of 12 positions shown; findings below may reference images not displayed]

ACR Breast Density Category b: There are scattered areas of
fibroglandular density.
FINDINGS: There is a persistent oval asymmetry in the posterior aspect of the
upper right breast in the oblique projection. This mildly irregular
margins on the tomographic images. There is normal appearing
fibroglandular tissue in this region in the craniocaudal projection.
No findings elsewhere in the breast suspicious for malignancy.

Mammographic images were processed with CAD.

On physical exam, there is an approximately 1.5 cm oval area of
focal palpable soft tissue thickening in the 12 o'clock position of
the right breast, 10 cm from the nipple.

Targeted ultrasound is performed, showing an island of normal
appearing dense glandular tissue with peaks and [REDACTED]s in the 12
o'clock position of the right breast, 10 cm from the nipple. This
corresponds to the mammographic asymmetry in the palpable
thickening. No mass or other findings suspicious malignancy
throughout the upper right breast.
IMPRESSION: Benign right breast asymmetry, corresponding to a normal island of
dense glandular tissue in the 12 o'clock position of the breast. No
evidence of malignancy.

RECOMMENDATION:
Bilateral screening mammogram in 5 months. That will be 1 year since
mammographic evaluation of the left breast.

I have discussed the findings and recommendations with the patient.
If applicable, a reminder letter will be sent to the patient
regarding the next appointment.

BI-RADS CATEGORY  2: Benign.

## 2021-07-13 IMAGING — US US BREAST*R* LIMITED INC AXILLA
1 series · 7 of 7 positions shown · non-contrast
Comparison: Previous exam(s).

CLINICAL DATA: Follow-up probably benign asymmetry in the upper
right breast in the oblique projection at previous mammography, with
a probable normal island of dense glandular tissue seen at
ultrasound.

EXAM:
DIGITAL DIAGNOSTIC RIGHT MAMMOGRAM WITH CAD AND TOMO
ULTRASOUND RIGHT BREAST

[Series 1: us breast*right* limited inc axilla · 0.09mm/px · 7 of 7 slices shown]
[im 1/7]
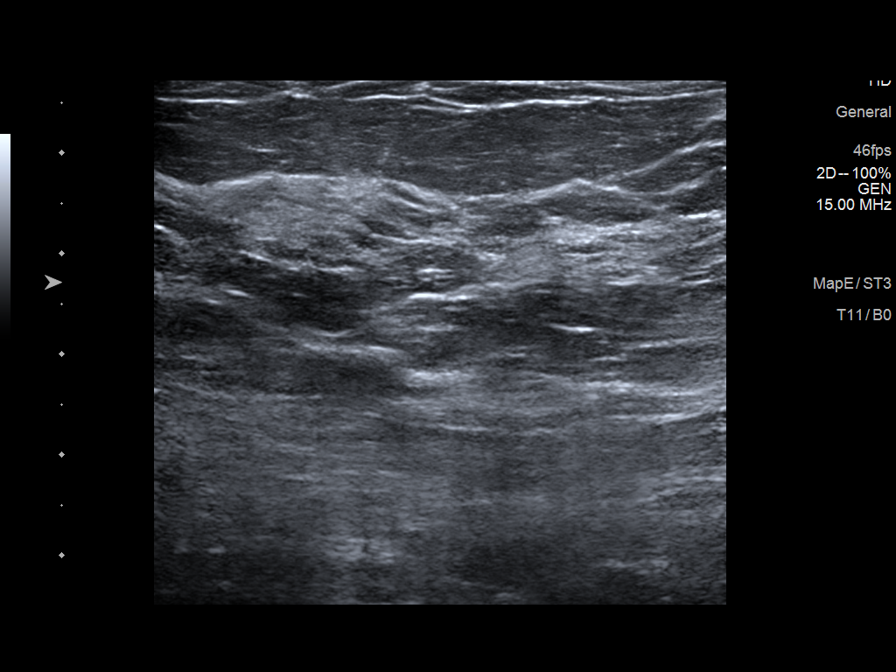
[im 2/7]
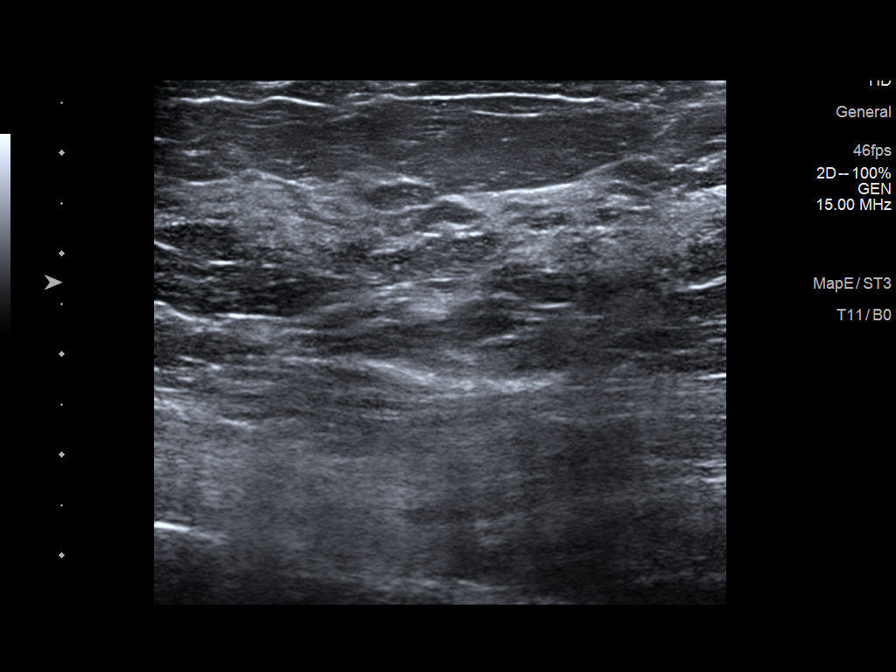
[im 3/7]
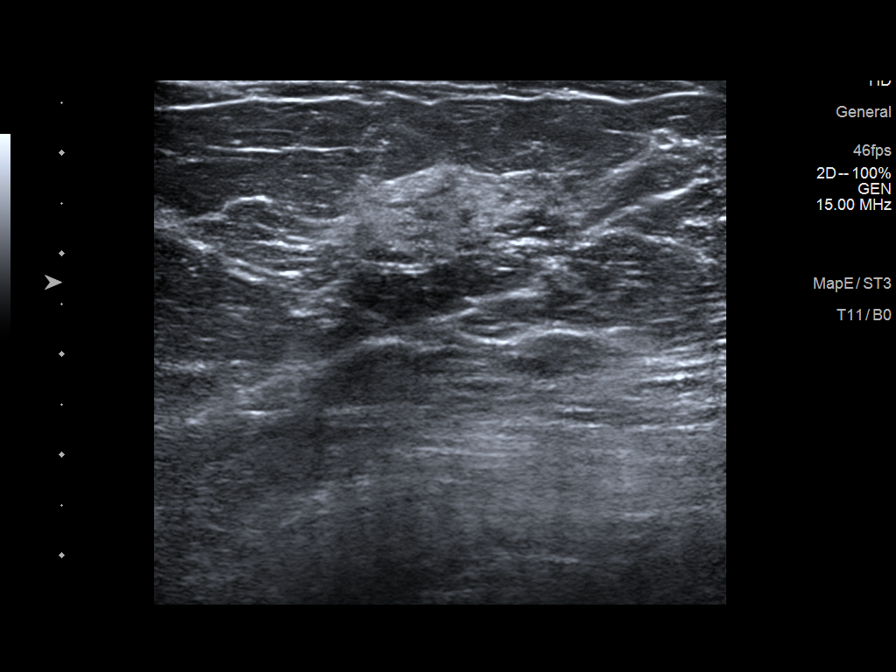
[im 4/7]
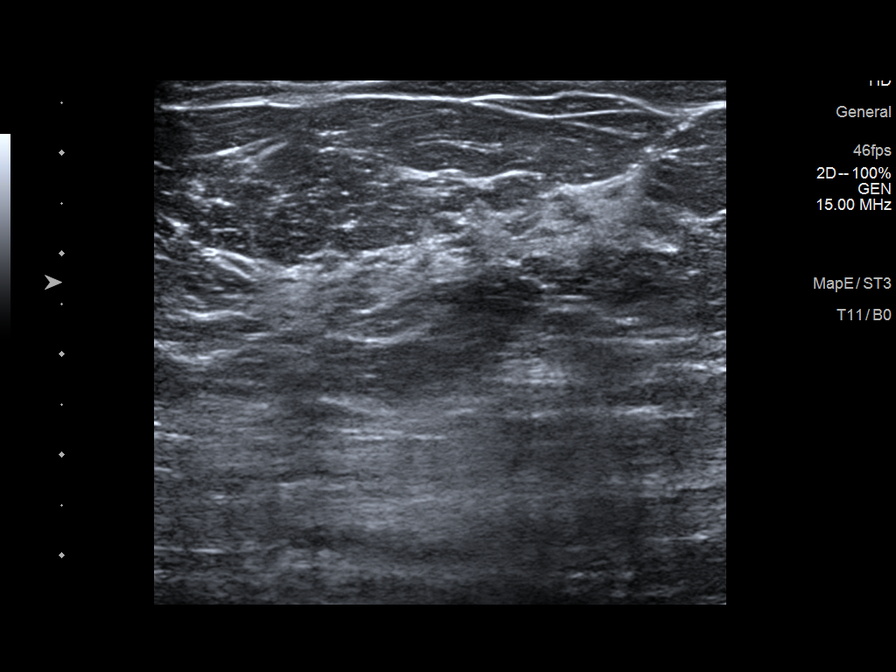
[im 5/7]
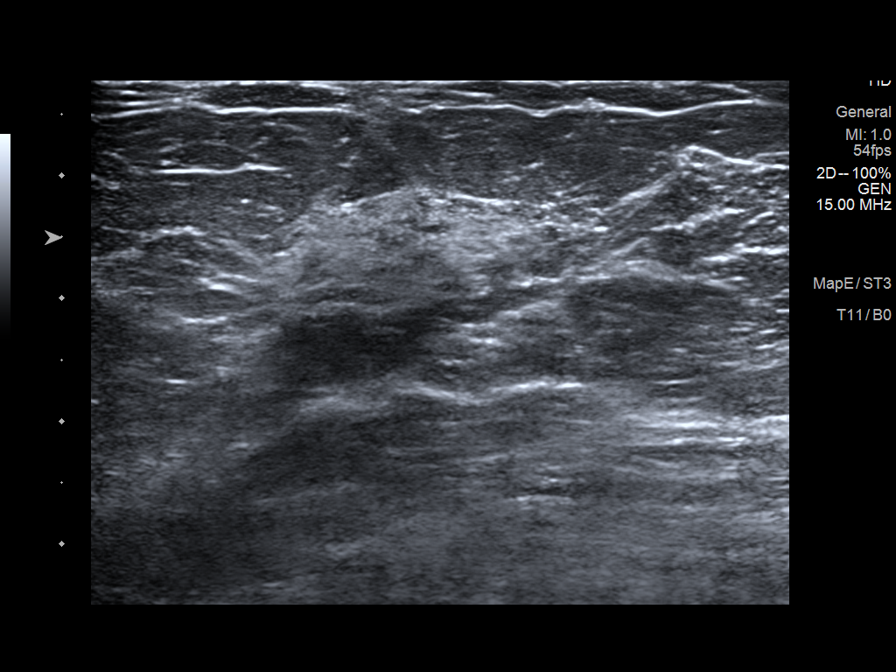
[im 6/7]
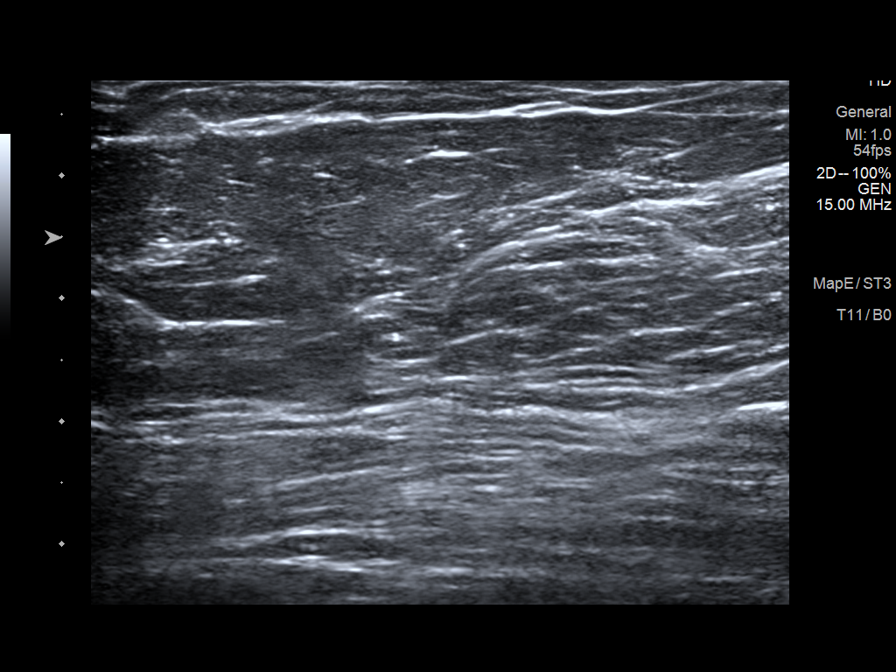
[im 7/7]
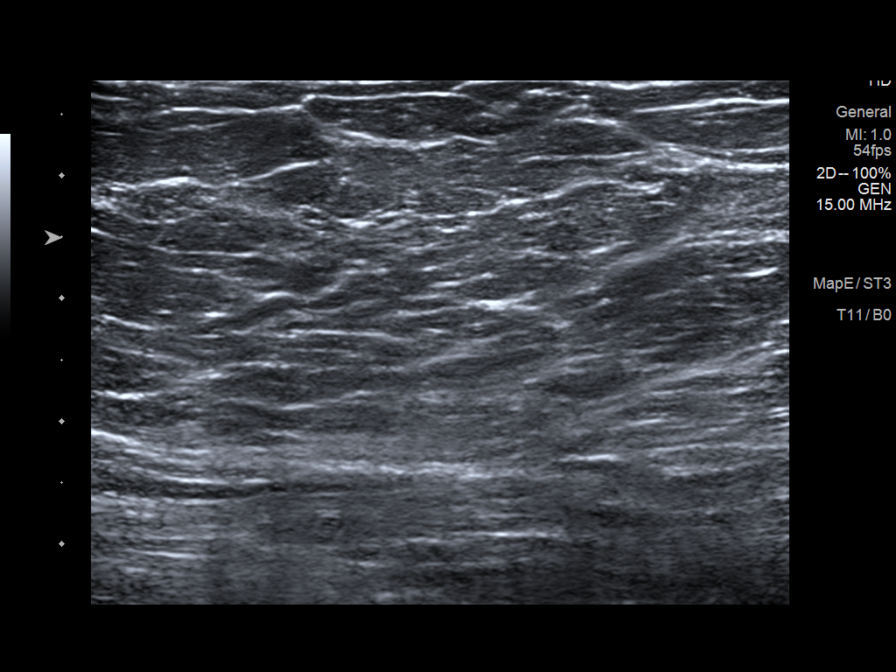

[7 of 7 positions shown; findings below may reference images not displayed]

ACR Breast Density Category b: There are scattered areas of
fibroglandular density.
FINDINGS: There is a persistent oval asymmetry in the posterior aspect of the
upper right breast in the oblique projection. This mildly irregular
margins on the tomographic images. There is normal appearing
fibroglandular tissue in this region in the craniocaudal projection.
No findings elsewhere in the breast suspicious for malignancy.

Mammographic images were processed with CAD.

On physical exam, there is an approximately 1.5 cm oval area of
focal palpable soft tissue thickening in the 12 o'clock position of
the right breast, 10 cm from the nipple.

Targeted ultrasound is performed, showing an island of normal
appearing dense glandular tissue with peaks and [REDACTED]s in the 12
o'clock position of the right breast, 10 cm from the nipple. This
corresponds to the mammographic asymmetry in the palpable
thickening. No mass or other findings suspicious malignancy
throughout the upper right breast.
IMPRESSION: Benign right breast asymmetry, corresponding to a normal island of
dense glandular tissue in the 12 o'clock position of the breast. No
evidence of malignancy.

RECOMMENDATION:
Bilateral screening mammogram in 5 months. That will be 1 year since
mammographic evaluation of the left breast.

I have discussed the findings and recommendations with the patient.
If applicable, a reminder letter will be sent to the patient
regarding the next appointment.

BI-RADS CATEGORY  2: Benign.

## 2021-08-12 ENCOUNTER — Other Ambulatory Visit: Payer: Self-pay | Admitting: Family Medicine

## 2021-08-12 DIAGNOSIS — Z1231 Encounter for screening mammogram for malignant neoplasm of breast: Secondary | ICD-10-CM

## 2021-09-02 ENCOUNTER — Ambulatory Visit: Payer: Self-pay

## 2021-10-27 ENCOUNTER — Ambulatory Visit
Admission: RE | Admit: 2021-10-27 | Discharge: 2021-10-27 | Disposition: A | Discharge status: Home / Self Care | Payer: BC Managed Care – PPO | Source: Ambulatory Visit | Attending: Family Medicine | Admitting: Family Medicine

## 2021-10-27 DIAGNOSIS — Z1231 Encounter for screening mammogram for malignant neoplasm of breast: Secondary | ICD-10-CM

## 2024-01-17 ENCOUNTER — Other Ambulatory Visit: Payer: Self-pay | Admitting: Physician Assistant

## 2024-01-17 DIAGNOSIS — Z1231 Encounter for screening mammogram for malignant neoplasm of breast: Secondary | ICD-10-CM

## 2024-02-07 ENCOUNTER — Ambulatory Visit

## 2024-03-01 ENCOUNTER — Ambulatory Visit

## 2024-03-23 ENCOUNTER — Ambulatory Visit

## 2024-04-17 ENCOUNTER — Ambulatory Visit
